# Patient Record
Sex: Female | Born: 2000 | Race: White | Hispanic: No | Marital: Single
Health system: Southern US, Community
[De-identification: ages and names within clinical notes are randomized; demographics above are authoritative.]

## PROBLEM LIST (undated history)

## (undated) DIAGNOSIS — R519 Headache, unspecified: Secondary | ICD-10-CM

## (undated) DIAGNOSIS — T7840XA Allergy, unspecified, initial encounter: Secondary | ICD-10-CM

## (undated) DIAGNOSIS — M549 Dorsalgia, unspecified: Secondary | ICD-10-CM

## (undated) DIAGNOSIS — F909 Attention-deficit hyperactivity disorder, unspecified type: Secondary | ICD-10-CM

## (undated) DIAGNOSIS — L309 Dermatitis, unspecified: Secondary | ICD-10-CM

## (undated) DIAGNOSIS — G8929 Other chronic pain: Secondary | ICD-10-CM

## (undated) DIAGNOSIS — F988 Other specified behavioral and emotional disorders with onset usually occurring in childhood and adolescence: Secondary | ICD-10-CM

## (undated) DIAGNOSIS — R066 Hiccough: Secondary | ICD-10-CM

## (undated) DIAGNOSIS — R51 Headache: Secondary | ICD-10-CM

## (undated) HISTORY — DX: Hiccough: R06.6

## (undated) HISTORY — DX: Allergy, unspecified, initial encounter: T78.40XA

## (undated) HISTORY — DX: Headache, unspecified: R51.9

## (undated) HISTORY — DX: Headache: R51

## (undated) HISTORY — PX: NO PAST SURGERIES: SHX2092

## (undated) HISTORY — DX: Attention-deficit hyperactivity disorder, unspecified type: F90.9

## (undated) HISTORY — DX: Dermatitis, unspecified: L30.9

## (undated) HISTORY — DX: Other specified behavioral and emotional disorders with onset usually occurring in childhood and adolescence: F98.8

---
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
## 2019-10-10 ENCOUNTER — Other Ambulatory Visit: Payer: Self-pay

## 2019-10-10 ENCOUNTER — Encounter (HOSPITAL_COMMUNITY): Payer: Self-pay | Admitting: Emergency Medicine

## 2019-10-10 ENCOUNTER — Emergency Department (HOSPITAL_COMMUNITY)
Admission: EM | Admit: 2019-10-10 | Discharge: 2019-10-10 | Disposition: A | Discharge status: Home / Self Care | Payer: BC Managed Care – PPO | Attending: Emergency Medicine | Admitting: Emergency Medicine

## 2019-10-10 DIAGNOSIS — N3000 Acute cystitis without hematuria: Secondary | ICD-10-CM | POA: Diagnosis not present

## 2019-10-10 DIAGNOSIS — Z7722 Contact with and (suspected) exposure to environmental tobacco smoke (acute) (chronic): Secondary | ICD-10-CM | POA: Diagnosis not present

## 2019-10-10 DIAGNOSIS — Z79899 Other long term (current) drug therapy: Secondary | ICD-10-CM | POA: Diagnosis not present

## 2019-10-10 DIAGNOSIS — R109 Unspecified abdominal pain: Secondary | ICD-10-CM | POA: Diagnosis present

## 2019-10-10 LAB — URINALYSIS, ROUTINE W REFLEX MICROSCOPIC
Bilirubin Urine: NEGATIVE
Glucose, UA: NEGATIVE mg/dL
Ketones, ur: NEGATIVE mg/dL
Leukocytes,Ua: NEGATIVE
Nitrite: POSITIVE — AB
Protein, ur: NEGATIVE mg/dL
Specific Gravity, Urine: 1.025 (ref 1.005–1.030)
pH: 6 (ref 5.0–8.0)

## 2019-10-10 LAB — PREGNANCY, URINE: Preg Test, Ur: NEGATIVE

## 2019-10-10 MED ORDER — SULFAMETHOXAZOLE-TRIMETHOPRIM 800-160 MG PO TABS: 1.0000 | ORAL_TABLET | Freq: Two times a day (BID) | ORAL | 0 refills | Status: AC

## 2019-10-10 MED ORDER — IBUPROFEN 800 MG PO TABS: ORAL_TABLET | ORAL | 0 refills | Status: AC

## 2019-10-10 NOTE — ED Provider Notes (Signed)
[facility] DEPT Provider Note   CSN: [id number] Arrival date & time: 10/10/19  1229     History Chief Complaint  Patient presents with  . Flank Pain    Anna Meyers is a 18 y.o. female.  Patient complains of right flank pain.  Patient states that she started having urinary tract symptoms a few days ago and it improved  The history is provided by the patient. No language interpreter was used.  Flank Pain This is a new problem. The current episode started 2 days ago. The problem occurs constantly. The problem has not changed since onset.Pertinent negatives include no chest pain, no abdominal pain and no headaches. Nothing aggravates the symptoms. Nothing relieves the symptoms. She has tried nothing for the symptoms. The treatment provided no relief.       Past Medical History:  Diagnosis Date  . ADD (attention deficit disorder)   . ADHD (attention deficit hyperactivity disorder)   . Allergy   . Chronic back pain   . Eczema   . Headache   . Hiccups 18 yrs old   can last up to hours, not related to eating, denies reflux or freq. burping    Patient Active Problem List   Diagnosis Date Noted  . Syncope 11/14/2016    Past Surgical History:  Procedure Laterality Date  . NO PAST SURGERIES       OB History   No obstetric history on file.     Family History  Problem Relation Age of Onset  . Bipolar disorder Maternal Grandmother   . Migraines Neg Hx   . Seizures Neg Hx   . Depression Neg Hx   . Anxiety disorder Neg Hx   . Schizophrenia Neg Hx   . ADD / ADHD Neg Hx   . Autism Neg Hx     Social History   Tobacco Use  . Smoking status: Passive Smoke Exposure - Never Smoker  . Smokeless tobacco: Never Used  . Tobacco comment: Parents smoke outside  Substance Use Topics  . Alcohol use: No  . Drug use: No    Home Medications Prior to Admission medications   Medication Sig Start Date End Date Taking? Authorizing Provider    benzonatate (TESSALON) 100 MG capsule Take 1 capsule (100 mg total) by mouth every 8 (eight) hours. 07/02/17   Barnet Glasgow, NP  ibuprofen (ADVIL) 800 MG tablet Take 1 pill every 8 hours for pain not relieved by Tylenol alone 10/10/19   Milton Ferguson, MD  ipratropium (ATROVENT) 0.06 % nasal spray Place 2 sprays into both nostrils 4 (four) times daily. 07/02/17   Barnet Glasgow, NP  methylphenidate 18 MG PO CR tablet Take 1 tablet by mouth daily. 03/22/14   [provider]  sulfamethoxazole-trimethoprim (BACTRIM DS) 800-160 MG tablet Take 1 tablet by mouth 2 (two) times daily for 7 days. 10/10/19 10/17/19  Milton Ferguson, MD    Allergies    Penicillins  Review of Systems   Review of Systems  Constitutional: Negative for appetite change and fatigue.  HENT: Negative for congestion, ear discharge and sinus pressure.   Eyes: Negative for discharge.  Respiratory: Negative for cough.   Cardiovascular: Negative for chest pain.  Gastrointestinal: Negative for abdominal pain and diarrhea.  Genitourinary: Positive for dysuria, flank pain and frequency. Negative for hematuria.  Musculoskeletal: Negative for back pain.  Skin: Negative for rash.  Neurological: Negative for seizures and headaches.  Psychiatric/Behavioral: Negative for hallucinations.    Physical Exam  Updated Vital Signs BP 117/88 (BP Location: Left Arm)   Pulse 70   Temp 98.3 F (36.8 C) (Oral)   Resp 16   Ht 5\' 5"  (1.651 m)   Wt 45.4 kg   SpO2 100%   BMI 16.64 kg/m   Physical Exam Vitals and nursing note reviewed.  Constitutional:      Appearance: She is well-developed.  HENT:     Head: Normocephalic.     Nose: Nose normal.  Eyes:     General: No scleral icterus.    Conjunctiva/sclera: Conjunctivae normal.  Neck:     Thyroid: No thyromegaly.  Cardiovascular:     Rate and Rhythm: Normal rate and regular rhythm.     Heart sounds: No murmur. No friction rub. No gallop.   Pulmonary:     Breath  sounds: No stridor. No wheezing or rales.  Chest:     Chest wall: No tenderness.  Abdominal:     General: There is no distension.     Tenderness: There is abdominal tenderness. There is no rebound.  Genitourinary:    Comments: Mild right flank tenderness Musculoskeletal:        General: Normal range of motion.     Cervical back: Neck supple.  Lymphadenopathy:     Cervical: No cervical adenopathy.  Skin:    Findings: No erythema or rash.  Neurological:     Mental Status: She is alert and oriented to person, place, and time.     Motor: No abnormal muscle tone.     Coordination: Coordination normal.  Psychiatric:        Behavior: Behavior normal.     ED Results / Procedures / Treatments   Labs (all labs ordered are listed, but only abnormal results are displayed) Labs Reviewed  URINALYSIS, ROUTINE W REFLEX MICROSCOPIC - Abnormal; Notable for the following components:      Result Value   Hgb urine dipstick MODERATE (*)    Nitrite POSITIVE (*)    Bacteria, UA MANY (*)    All other components within normal limits  PREGNANCY, URINE    EKG None  Radiology No results found.  Procedures Procedures (including critical care time)  Medications Ordered in ED Medications - No data to display  ED Course  I have reviewed the triage vital signs and the nursing notes.  Pertinent labs & imaging results that were available during my care of the patient were reviewed by me and considered in my medical decision making (see chart for details).    MDM Rules/Calculators/A&P                      Urinalysis suggest urinary tract infection.  Patient will be placed on Bactrim and is given 800 mg Motrin for pain.  She will follow-up with her primary care doctor or GYN doctor next week if not improving Final Clinical Impression(s) / ED Diagnoses Final diagnoses:  Acute cystitis without hematuria    Rx / DC Orders ED Discharge Orders         Ordered    sulfamethoxazole-trimethoprim  (BACTRIM DS) 800-160 MG tablet  2 times daily     10/10/19 1405    ibuprofen (ADVIL) 800 MG tablet     10/10/19 1405           10/12/19, MD 10/10/19 1408

## 2019-10-10 NOTE — Discharge Instructions (Addendum)
Drink plenty of fluids and follow-up with your family doctor or OB/GYN doctor if not improving

## 2019-10-10 NOTE — ED Triage Notes (Signed)
Pt complaint of lower back pain 2 hours ago but better with OTC pain medication; pt verbalizes UTI symptoms for 3 days.
